# Patient Record
Sex: Female | Born: 1950 | Race: White | Hispanic: No | Marital: Married | Smoking: Never smoker
Health system: Southern US, Community
[De-identification: ages and names within clinical notes are randomized; demographics above are authoritative.]

---

## 1979-02-19 HISTORY — PX: TUBAL LIGATION: SHX77

## 2007-05-01 ENCOUNTER — Emergency Department (HOSPITAL_COMMUNITY): Admission: EM | Admit: 2007-05-01 | Discharge: 2007-05-01 | Payer: Self-pay | Admitting: Emergency Medicine

## 2007-09-14 ENCOUNTER — Emergency Department (HOSPITAL_COMMUNITY): Admission: EM | Admit: 2007-09-14 | Discharge: 2007-09-14 | Payer: Self-pay | Admitting: Emergency Medicine

## 2008-01-02 ENCOUNTER — Emergency Department (HOSPITAL_COMMUNITY): Admission: EM | Admit: 2008-01-02 | Discharge: 2008-01-02 | Payer: Self-pay | Admitting: Emergency Medicine

## 2009-07-25 ENCOUNTER — Emergency Department (HOSPITAL_COMMUNITY): Admission: EM | Admit: 2009-07-25 | Discharge: 2009-07-25 | Payer: Self-pay | Admitting: Family Medicine

## 2009-12-09 ENCOUNTER — Emergency Department (HOSPITAL_COMMUNITY)
Admission: EM | Admit: 2009-12-09 | Discharge: 2009-12-09 | Payer: Self-pay | Source: Home / Self Care | Admitting: Emergency Medicine

## 2010-11-20 LAB — POCT URINALYSIS DIP (DEVICE)
Glucose, UA: NEGATIVE mg/dL
Nitrite: NEGATIVE
Operator id: 282151
Protein, ur: NEGATIVE mg/dL
Specific Gravity, Urine: 1.01 (ref 1.005–1.030)
Urobilinogen, UA: 0.2 mg/dL (ref 0.0–1.0)

## 2010-11-20 LAB — URINE CULTURE

## 2011-05-13 ENCOUNTER — Emergency Department (HOSPITAL_COMMUNITY)
Admission: EM | Admit: 2011-05-13 | Discharge: 2011-05-13 | Disposition: A | Discharge status: Home / Self Care | Payer: Self-pay | Attending: Emergency Medicine | Admitting: Emergency Medicine

## 2011-05-13 ENCOUNTER — Encounter (HOSPITAL_COMMUNITY): Payer: Self-pay | Admitting: *Deleted

## 2011-05-13 DIAGNOSIS — R300 Dysuria: Secondary | ICD-10-CM | POA: Insufficient documentation

## 2011-05-13 LAB — URINALYSIS, ROUTINE W REFLEX MICROSCOPIC
Bilirubin Urine: NEGATIVE
Glucose, UA: NEGATIVE mg/dL
Leukocytes, UA: NEGATIVE
Nitrite: NEGATIVE
Specific Gravity, Urine: 1.019 (ref 1.005–1.030)
pH: 5.5 (ref 5.0–8.0)

## 2011-05-13 MED ORDER — FLUCONAZOLE 200 MG PO TABS: 200.0000 mg | ORAL_TABLET | Freq: Every day | ORAL | Status: AC

## 2011-05-13 MED ORDER — SULFAMETHOXAZOLE-TRIMETHOPRIM 800-160 MG PO TABS: 1.0000 | ORAL_TABLET | Freq: Two times a day (BID) | ORAL | Status: AC

## 2011-05-13 NOTE — ED Provider Notes (Signed)
History    61 year old female with dysuria. Onset about a week ago. Crampy lower abdominal pain. Dysuria increased frequency. No back pain. No fevers or chills. Denies or vomiting. History of prior UTI and says current symptoms are similar. Postmenopausal. No unusual vaginal bleeding or discharge.  CSN: [id number]  Arrival date & time 05/13/11  1212   First MD Initiated Contact with Patient 05/13/11 1311      Chief Complaint  Patient presents with  . Dysuria    frquency    (Consider location/radiation/quality/duration/timing/severity/associated sxs/prior treatment) HPI  History reviewed. No pertinent past medical history.  Past Surgical History  Procedure Date  . Tubal ligation     No family history on file.  History  Substance Use Topics  . Smoking status: Never Smoker   . Smokeless tobacco: Not on file  . Alcohol Use: No    OB History    Grav Para Term Preterm Abortions TAB SAB Ect Mult Living                  Review of Systems   Review of symptoms negative unless otherwise noted in HPI.   Allergies  Review of patient's allergies indicates no known allergies.  Home Medications   Current Outpatient Rx  Name Route Sig Dispense Refill  . ASPIRIN-ACETAMINOPHEN-CAFFEINE 250-250-65 MG PO TABS Oral Take 1 tablet by mouth every 6 (six) hours as needed. For headache      BP 157/74  Pulse 101  Temp(Src) 98.3 F (36.8 C) (Oral)  Resp 18  SpO2 98%  Physical Exam  Nursing note and vitals reviewed. Constitutional: She appears well-developed and well-nourished. No distress.  HENT:  Head: Normocephalic and atraumatic.  Eyes: Conjunctivae are normal. Pupils are equal, round, and reactive to light. Right eye exhibits no discharge. Left eye exhibits no discharge.  Neck: Neck supple.  Cardiovascular: Normal rate, regular rhythm and normal heart sounds.  Exam reveals no gallop and no friction rub.   No murmur heard. Pulmonary/Chest: Effort normal and breath sounds  normal. No respiratory distress.  Abdominal: Soft. She exhibits no distension and no mass. There is no tenderness.  Genitourinary:       No CVA tenderness  Musculoskeletal: She exhibits no edema and no tenderness.  Neurological: She is alert.  Skin: Skin is warm and dry. She is not diaphoretic.  Psychiatric: She has a normal mood and affect. Her behavior is normal. Thought content normal.    ED Course  Procedures (including critical care time)   Labs Reviewed  URINALYSIS, ROUTINE W REFLEX MICROSCOPIC   No results found.   1. Dysuria       MDM  61 year old female with dysuria. Urinalysis is not suggestive of infection at all. Discussed and recommend with patient pelvic examination. Patient does not want this at this time. Explained to her that this may be not be urinary tract infection and I cannot fully evaluate her without doing a pelvic examination. Pt would rather try course of abx. Will give course of bactrim. Strongly encouraged pt to return for evaluation if her symptoms do not improve within a couple days and immediately if anything changed or worsens.        Raeford Razor, MD 05/15/11 1447

## 2011-05-13 NOTE — ED Notes (Signed)
PT reports 2 wk HX of dysuria

## 2011-05-13 NOTE — ED Notes (Signed)
Pt states she thinks she is fighting a urinary tract infection - experiencing burning, frequency, painful urination.  Pt has tried OTC meds for urinary infection.

## 2011-05-13 NOTE — Discharge Instructions (Signed)
Dysuria Dysuria is the medical term for pain with urination. There are many causes for dysuria, but urinary tract infection is the most common. If a urinalysis was performed it can show that there is a urinary tract infection. A urine culture confirms that you or your child is sick. You will need to follow up with a healthcare provider because:  If a urine culture was done you will need to know the culture results and treatment recommendations.   If the urine culture was positive, you or your child will need to be put on antibiotics or know if the antibiotics prescribed are the right antibiotics for your urinary tract infection.   If the urine culture is negative (no urinary tract infection), then other causes may need to be explored or antibiotics need to be stopped.  Today laboratory work may have been done and there does not seem to be an infection. If cultures were done they will take at least 24 to 48 hours to be completed. Today x-rays may have been taken and they read as normal. No cause can be found for the problems. The x-rays may be re-read by a radiologist and you will be contacted if additional findings are made. You or your child may have been put on medications to help with this problem until you can see your primary caregiver. If the problems get better, see your primary caregiver if the problems return. If you were given antibiotics (medications which kill germs), take all of the mediations as directed for the full course of treatment.  If laboratory work was done, you need to find the results. Leave a telephone number where you can be reached. If this is not possible, make sure you find out how you are to get test results. HOME CARE INSTRUCTIONS   Drink lots of fluids. For adults, drink eight, 8 ounce glasses of clear juice or water a day. For children, replace fluids as suggested by your caregiver.   Empty the bladder often. Avoid holding urine for long periods of time.   After a  bowel movement, women should cleanse front to back, using each tissue only once.   Empty your bladder before and after sexual intercourse.   Take all the medicine given to you until it is gone. You may feel better in a few days, but TAKE ALL MEDICINE.   Avoid caffeine, tea, alcohol and carbonated beverages, because they tend to irritate the bladder.   In men, alcohol may irritate the prostate.   Only take over-the-counter or prescription medicines for pain, discomfort, or fever as directed by your caregiver.   If your caregiver has given you a follow-up appointment, it is very important to keep that appointment. Not keeping the appointment could result in a chronic or permanent injury, pain, and disability. If there is any problem keeping the appointment, you must call back to this facility for assistance.  SEEK IMMEDIATE MEDICAL CARE IF:   Back pain develops.   A fever develops.   There is nausea (feeling sick to your stomach) or vomiting (throwing up).   Problems are no better with medications or are getting worse.  MAKE SURE YOU:   Understand these instructions.   Will watch your condition.   Will get help right away if you are not doing well or get worse.  Document Released: 11/03/2003 Document Revised: 01/24/2011 Document Reviewed: 09/10/2007 [facility] Patient Information 2012 [place], [place].  RESOURCE GUIDE  Dental Problems  Patients with Medicaid: [facility]  [facility] [address]                                           [phone] [address] [employer] [address] Phone:  [phone]                                                  Phone:  [phone]  If unable to pay or uninsured, contact:  [facility] or [facility]. to become qualified for the adult dental clinic.  Chronic Pain Problems Contact [facility]  [phone] Patients need to be referred by their primary care doctor.  Insufficient Money for  Medicine Contact United Way:  call "211" or [facility] (469)640-9145.  No Primary Care Doctor Call [facility]  [phone] Other agencies that provide inexpensive medical care    [facility]  [phone]    [facility]  [phone]    [facility]  5757356892    [facility]  [phone]    [facility]  [phone]    [facility]  [phone]  Psychological Services [facility]  [phone] [facility]  [phone] [facility] Mental [facility]   [phone] (emergency services (612) [phone])  Substance Abuse Resources [facility]  (743)710-4335 [facility] 830-646-4075 [facility] [place] [phone] Floyd[facility] Flock (867)088-4350 Residential & Outpatient Substance Abuse Program  562-724-8715  Abuse/Neglect [facility] Child Abuse Hotline 340-085-3923 [facility] Child Abuse Hotline [phone] (After Hours)  Emergency Shelter [facility] [phone]  Maternity Homes [facility] [phone] [facility] [phone]  MRSA Hotline #:   601-010-4064    [facility]  [facility]     [employer]                          [facility]. [address] [place]                       [address]      [address]  Blondell Reveal Phone:  [phone]                                   Phone:  [phone]                 Phone:  [phone]  [facility] Mental Health Phone:  [phone]  [facility] Child Abuse Hotline (575) [phone] (After Hours)

## 2011-07-03 ENCOUNTER — Encounter (HOSPITAL_COMMUNITY): Payer: Self-pay | Admitting: *Deleted

## 2011-07-03 ENCOUNTER — Emergency Department (HOSPITAL_COMMUNITY)
Admission: EM | Admit: 2011-07-03 | Discharge: 2011-07-04 | Disposition: A | Discharge status: Home / Self Care | Payer: Self-pay | Attending: Emergency Medicine | Admitting: Emergency Medicine

## 2011-07-03 DIAGNOSIS — K047 Periapical abscess without sinus: Secondary | ICD-10-CM | POA: Insufficient documentation

## 2011-07-03 MED ORDER — PENICILLIN V POTASSIUM 250 MG PO TABS
500.0000 mg | ORAL_TABLET | Freq: Once | ORAL | Status: AC
  Administered 2011-07-03: 500 mg via ORAL
  Filled 2011-07-03: qty 2

## 2011-07-03 MED ORDER — OXYCODONE-ACETAMINOPHEN 5-325 MG PO TABS
2.0000 | ORAL_TABLET | Freq: Once | ORAL | Status: AC
  Administered 2011-07-03: 2 via ORAL
  Filled 2011-07-03: qty 2

## 2011-07-03 NOTE — ED Provider Notes (Addendum)
History     CSN: [id number]  Arrival date & time 07/03/11  1954   First MD Initiated Contact with Patient 07/03/11 2307      Chief Complaint  Patient presents with  . Dental Pain    (Consider location/radiation/quality/duration/timing/severity/associated sxs/prior treatment) HPI 61 yo female presents to emergency department complaining of right upper jaw pain. Patient reports she broke a tooth about 7-10 days ago.  This morning at 4 AM she had acute onset of pain in her right upper gums radiating up into her cheek. Throughout the day she's been taking Advil without any relief. She has had no fevers. She reports her dentist is currently on vacation in the [place]. He is to return back next week.  No prior h/o similar sxs.  No difficulties swallowing, breathing.  History reviewed. No pertinent past medical history.  Past Surgical History  Procedure Date  . Tubal ligation     No family history on file.  History  Substance Use Topics  . Smoking status: Never Smoker   . Smokeless tobacco: Not on file  . Alcohol Use: No    OB History    Grav Para Term Preterm Abortions TAB SAB Ect Mult Living                  Review of Systems  All other systems reviewed and are negative.    Allergies  Review of patient's allergies indicates no known allergies.  Home Medications  No current outpatient prescriptions on file.  BP 141/96  Pulse 75  Temp(Src) 98 F (36.7 C) (Oral)  Resp 18  SpO2 98%  Physical Exam  Constitutional: She appears well-developed and well-nourished.       Uncomfortable, irritable  HENT:  Head: Normocephalic and atraumatic.  Nose: Nose normal.       Broken first molar noted on right upper jaw with swelling perimolar without drainage, pain with palpation of area and of right cheek  Eyes: Conjunctivae and EOM are normal. Pupils are equal, round, and reactive to light.  Neck: Normal range of motion. Neck supple. No JVD present. No tracheal deviation  present. No thyromegaly present.  Cardiovascular: Normal rate, regular rhythm and intact distal pulses.   Pulmonary/Chest: Breath sounds normal. No stridor. No respiratory distress. She has no wheezes. She has no rales. She exhibits no tenderness.  Musculoskeletal: Normal range of motion. She exhibits no edema and no tenderness.  Lymphadenopathy:    She has no cervical adenopathy.  Skin: Skin is warm and dry. No rash noted. No erythema. No pallor.  Psychiatric: She has a normal mood and affect. Her behavior is normal. Judgment and thought content normal.    ED Course  Procedures (including critical care time)  Labs Reviewed - No data to display No results found.   1. Dental abscess       MDM  Dental abscess, will tx with abx, percocet.  Pt told to call dentist office to see if they have a covering dentist.  Will also give on call dental information        Olivia Mackie, MD 07/04/11 4098  Olivia Mackie, MD 07/04/11 [phone]

## 2011-07-03 NOTE — ED Notes (Signed)
Pt reports rt cheek swelling noted yesterday evening - pt reports dental pain to rt upper as well. Pt admits to breaking a tooth earlier this week in that area. Pt denies fever at present. Pt in no acute distress, family at bedside x1

## 2011-07-03 NOTE — ED Notes (Signed)
Patient reports she woke at 0400 today with pain in the right upper side of her mouth.  Patient states she has tried otc w/o relief.  Ibuprofen provides temporary relief.  Patient dentist is out of town in the [place].  Patient does have a tooth that broke on the same side last week.  Patient last took ibuprofen 4 hours ago

## 2011-07-04 MED ORDER — PENICILLIN V POTASSIUM 500 MG PO TABS: 500.0000 mg | ORAL_TABLET | Freq: Four times a day (QID) | ORAL | Status: AC

## 2011-07-04 MED ORDER — OXYCODONE-ACETAMINOPHEN 5-325 MG PO TABS: 2.0000 | ORAL_TABLET | ORAL | Status: AC | PRN

## 2011-07-04 NOTE — Discharge Instructions (Signed)
Please take medications as prescribed.  Return to the ER for worsening condition or new concerning symptoms.  Call your dentist office tomorrow to see if they have a covering dentist while yours is on vacation.  Otherwise, call the dentist listed.  Dental Abscess A dental abscess usually starts from an infected tooth. Antibiotic medicine and pain pills can be helpful, but dental infections require the attention of a dentist. Rinse around the infected area often with salt water (a pinch of salt in 8 oz of warm water). Do not apply heat to the outside of your face. See your dentist or oral surgeon as soon as possible.  SEEK IMMEDIATE MEDICAL CARE IF:  You have increasing, severe pain that is not relieved by medicine.   You or your child has an oral temperature above 102 F (38.9 C), not controlled by medicine.   Your baby is older than 3 months with a rectal temperature of 102 F (38.9 C) or higher.   Your baby is 54 months old or younger with a rectal temperature of 100.4 F (38 C) or higher.   You develop chills, severe headache, difficulty breathing, or trouble swallowing.   You have swelling in the neck or around the eye.  Document Released: 02/04/2005 Document Revised: 01/24/2011 Document Reviewed: 07/16/2006 [facility] Patient Information 2012 [place], [place].

## 2011-07-04 NOTE — ED Notes (Signed)
Rx given x2 D/c instructions reviewed w/ pt and family - pt and family deny any further questions or concerns at present. Pt ambulating independently w/ steady gait on d/c - A&Ox4 in no acute distress, family at bedside to drive pt home.
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
## 2012-10-28 ENCOUNTER — Emergency Department (HOSPITAL_COMMUNITY)
Admission: EM | Admit: 2012-10-28 | Discharge: 2012-10-28 | Discharge status: Against Medical Advice | Payer: Self-pay | Attending: Emergency Medicine | Admitting: Emergency Medicine

## 2012-10-28 ENCOUNTER — Encounter (HOSPITAL_COMMUNITY): Payer: Self-pay

## 2012-10-28 DIAGNOSIS — Y9289 Other specified places as the place of occurrence of the external cause: Secondary | ICD-10-CM | POA: Insufficient documentation

## 2012-10-28 DIAGNOSIS — T63001A Toxic effect of unspecified snake venom, accidental (unintentional), initial encounter: Secondary | ICD-10-CM | POA: Insufficient documentation

## 2012-10-28 DIAGNOSIS — Y9389 Activity, other specified: Secondary | ICD-10-CM | POA: Insufficient documentation

## 2012-10-28 DIAGNOSIS — T6391XA Toxic effect of contact with unspecified venomous animal, accidental (unintentional), initial encounter: Secondary | ICD-10-CM | POA: Insufficient documentation
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
## 2013-03-19 ENCOUNTER — Encounter (HOSPITAL_COMMUNITY): Payer: Self-pay | Admitting: Emergency Medicine

## 2013-03-19 ENCOUNTER — Emergency Department (HOSPITAL_COMMUNITY)
Admission: EM | Admit: 2013-03-19 | Discharge: 2013-03-19 | Disposition: A | Discharge status: Home / Self Care | Payer: Self-pay | Attending: Emergency Medicine | Admitting: Emergency Medicine

## 2013-03-19 DIAGNOSIS — K0889 Other specified disorders of teeth and supporting structures: Secondary | ICD-10-CM

## 2013-03-19 DIAGNOSIS — K089 Disorder of teeth and supporting structures, unspecified: Secondary | ICD-10-CM | POA: Insufficient documentation

## 2013-03-19 DIAGNOSIS — K029 Dental caries, unspecified: Secondary | ICD-10-CM | POA: Insufficient documentation

## 2013-03-19 MED ORDER — AMOXICILLIN 500 MG PO CAPS: 500.0000 mg | ORAL_CAPSULE | Freq: Three times a day (TID) | ORAL | Status: DC

## 2013-03-19 MED ORDER — HYDROCODONE-ACETAMINOPHEN 5-325 MG PO TABS: 1.0000 | ORAL_TABLET | ORAL | Status: DC | PRN

## 2013-03-19 MED ORDER — HYDROCODONE-ACETAMINOPHEN 5-325 MG PO TABS
1.0000 | ORAL_TABLET | Freq: Once | ORAL | Status: AC
  Administered 2013-03-19: 1 via ORAL
  Filled 2013-03-19: qty 1

## 2013-03-19 NOTE — ED Provider Notes (Signed)
CSN: [id number]     Arrival date & time 03/19/13  1906 History  This chart was scribed for non-physician practitioner Wylene SimmerFrancis Leeah Politano, PA-C working with Juliet RudeNathan R. Rubin PayorPickering, MD by Joaquin MusicKristina Sanchez-Matthews, ED Scribe. This patient was seen in room TR04C/TR04C and the patient's care was started at  8:07 PM  Chief Complaint  Patient presents with  . Oral Pain   The history is provided by the patient. No language interpreter was used.   HPI Comments: Arvin CollardGale T Bagg is a 63 y.o. female who presents to the Emergency Department complaining of ongoing worsening R sided dental pain that radiates up R ear to R lower jaw that began 3 days ago. Pt states she has "flare-ups of pain that generally begin shortly after brushing her teeth". Pt describes the pain as a burning sensation. Pt states she generally grinds her teeth and bites her mouth during her sleep and states "she has been doing this for years". Pt states she has been taking Ibuprofen without relief but states she has been using orajel and reports having relief. Pt states her pain generally returns the following day. Pt states she has a hx of oral abscess. Pt denies having L sided dental or ear pain. Pt denies rash.  History reviewed. No pertinent past medical history. Past Surgical History  Procedure Laterality Date  . Tubal ligation     No family history on file. History  Substance Use Topics  . Smoking status: Never Smoker   . Smokeless tobacco: Not on file  . Alcohol Use: No   OB History   Grav Para Term Preterm Abortions TAB SAB Ect Mult Living                 Review of Systems  All other systems reviewed and are negative.    Allergies  Review of patient's allergies indicates no known allergies.  Home Medications   Current Outpatient Rx  Name  Route  Sig  Dispense  Refill  . acetaminophen (TYLENOL) 500 MG tablet   Oral   Take 500 mg by mouth daily as needed for mild pain.         Marland Kitchen. ibuprofen (ADVIL,MOTRIN) 200 MG  tablet   Oral   Take 600 mg by mouth every 8 (eight) hours as needed for mild pain.          BP 164/82  Pulse 76  Temp(Src) 98.1 F (36.7 C)  Resp 16  Ht 5\' 9"  (1.753 m)  Wt 182 lb 9 oz (82.81 kg)  BMI 26.95 kg/m2  SpO2 99%  Physical Exam  Constitutional: She is oriented to person, place, and time. She appears well-developed and well-nourished. No distress.  HENT:  Head: Normocephalic and atraumatic.  Right Ear: External ear normal.  Left Ear: External ear normal.  Nose: Nose normal.  Mouth/Throat: Oropharynx is clear and moist. No oropharyngeal exudate.  No rash or vesiclular lesions noted, no facial pain with palpation - dentition poor with multiple caries, gingivitis noted.  No trismus, soft tissue edema noted.  Eyes: Conjunctivae are normal. Pupils are equal, round, and reactive to light. No scleral icterus.  Neck: Normal range of motion. Neck supple.  Pulmonary/Chest: Effort normal.  Musculoskeletal: Normal range of motion. She exhibits no edema and no tenderness.  Lymphadenopathy:    She has no cervical adenopathy.  Neurological: She is alert and oriented to person, place, and time. She exhibits normal muscle tone. Coordination normal.  Skin: Skin is warm and dry. No rash  noted. No erythema. No pallor.  Psychiatric: She has a normal mood and affect. Her behavior is normal. Judgment and thought content normal.    ED Course  Procedures DIAGNOSTIC STUDIES: Oxygen Saturation is 99% on RA, normal by my interpretation.    COORDINATION OF CARE: 8:13 PM-Discussed treatment plan which includes administer pain medication while in ED and advised pt to F/U with dentist. Pt agreed to plan.   Labs Review Labs Reviewed - No data to display Imaging Review No results found.  EKG Interpretation   None      MDM  Dental pain  Patient here with dental pain not relieved with OTC medication - will place on antibiotic and pain control - advised to see dentist, no evidence of  herpes zoster, trigeminal neuralgia, PTA, facial or soft tissue infection.  I personally performed the services described in this documentation, which was scribed in my presence. The recorded information has been reviewed and is accurate.    Izola Price Marisue Humble, PA-C 03/19/13 2022

## 2013-03-19 NOTE — ED Notes (Signed)
The pt has had mouth pain for 3 days.  Previous history of the same.  This time it did not go away .

## 2013-03-19 NOTE — Discharge Instructions (Signed)
Dental Pain °A tooth ache may be caused by cavities (tooth decay). Cavities expose the nerve of the tooth to air and hot or cold temperatures. It may come from an infection or abscess (also called a boil or furuncle) around your tooth. It is also often caused by dental caries (tooth decay). This causes the pain you are having. °DIAGNOSIS  °Your caregiver can diagnose this problem by exam. °TREATMENT  °· If caused by an infection, it may be treated with medications which kill germs (antibiotics) and pain medications as prescribed by your caregiver. Take medications as directed. °· Only take over-the-counter or prescription medicines for pain, discomfort, or fever as directed by your caregiver. °· Whether the tooth ache today is caused by infection or dental disease, you should see your dentist as soon as possible for further care. °SEEK MEDICAL CARE IF: °The exam and treatment you received today has been provided on an emergency basis only. This is not a substitute for complete medical or dental care. If your problem worsens or new problems (symptoms) appear, and you are unable to meet with your dentist, call or return to this location. °SEEK IMMEDIATE MEDICAL CARE IF:  °· You have a fever. °· You develop redness and swelling of your face, jaw, or neck. °· You are unable to open your mouth. °· You have severe pain uncontrolled by pain medicine. °MAKE SURE YOU:  °· Understand these instructions. °· Will watch your condition. °· Will get help right away if you are not doing well or get worse. °Document Released: 02/04/2005 Document Revised: 04/29/2011 Document Reviewed: 09/23/2007 °[employer]® Patient Information ©2014 [employer]. ° °Dental Care and Dentist Visits °Dental care supports good overall health. Regular dental visits can also help you avoid dental pain, bleeding, infection, and other more serious health problems in the future. It is important to keep the mouth healthy because diseases in the teeth, gums,  and other oral tissues can spread to other areas of the body. Some problems, such as diabetes, heart disease, and pre-term labor have been associated with poor oral health.  °See your dentist every 6 months. If you experience emergency problems such as a toothache or broken tooth, go to the dentist right away. If you see your dentist regularly, you may catch problems early. It is easier to be treated for problems in the early stages.  °WHAT TO EXPECT AT A DENTIST VISIT  °Your dentist will look for many common oral health problems and recommend proper treatment. At your regular dental visit, you can expect: °· Gentle cleaning of the teeth and gums. This includes scraping and polishing. This helps to remove the sticky substance around the teeth and gums (plaque). Plaque forms in the mouth shortly after eating. Over time, plaque hardens on the teeth as tartar. If tartar is not removed regularly, it can cause problems. Cleaning also helps remove stains. °· Periodic X-rays. These pictures of the teeth and supporting bone will help your dentist assess the health of your teeth. °· Periodic fluoride treatments. Fluoride is a natural mineral shown to help strengthen teeth. Fluoride treatment involves applying a fluoride gel or varnish to the teeth. It is most commonly done in children. °· Examination of the mouth, tongue, jaws, teeth, and gums to look for any oral health problems, such as: °· Cavities (dental caries). This is decay on the tooth caused by plaque, sugar, and acid in the mouth. It is best to catch a cavity when it is small. °· Inflammation of the gums   caused by plaque buildup (gingivitis). °· Problems with the mouth or malformed or misaligned teeth. °· Oral cancer or other diseases of the soft tissues or jaws.  °KEEP YOUR TEETH AND GUMS HEALTHY °For healthy teeth and gums, follow these general guidelines as well as your dentist's specific advice: °· Have your teeth professionally cleaned at the dentist every 6  months. °· Brush twice daily with a fluoride toothpaste. °· Floss your teeth daily.  °· Ask your dentist if you need fluoride supplements, treatments, or fluoride toothpaste. °· Eat a healthy diet. Reduce foods and drinks with added sugar. °· Avoid smoking. °TREATMENT FOR ORAL HEALTH PROBLEMS °If you have oral health problems, treatment varies depending on the conditions present in your teeth and gums. °· Your caregiver will most likely recommend good oral hygiene at each visit. °· For cavities, gingivitis, or other oral health disease, your caregiver will perform a procedure to treat the problem. This is typically done at a separate appointment. Sometimes your caregiver will refer you to another dental specialist for specific tooth problems or for surgery. °SEEK IMMEDIATE DENTAL CARE IF: °· You have pain, bleeding, or soreness in the gum, tooth, jaw, or mouth area. °· A permanent tooth becomes loose or separated from the gum socket. °· You experience a blow or injury to the mouth or jaw area. °Document Released: 10/17/2010 Document Revised: 04/29/2011 Document Reviewed: 10/17/2010 °ExitCare® Patient Information ©2014 [employer]. ° °

## 2013-03-21 NOTE — ED Provider Notes (Signed)
Medical screening examination/treatment/procedure(s) were performed by non-physician practitioner and as supervising physician I was immediately available for consultation/collaboration.  EKG Interpretation   None        Juliet RudeNathan R. Rubin PayorPickering, MD 03/21/13 [phone]

## 2013-05-21 ENCOUNTER — Encounter (HOSPITAL_COMMUNITY): Payer: Self-pay | Admitting: Emergency Medicine

## 2013-05-21 ENCOUNTER — Emergency Department (HOSPITAL_COMMUNITY)
Admission: EM | Admit: 2013-05-21 | Discharge: 2013-05-21 | Disposition: A | Discharge status: Home / Self Care | Payer: Self-pay | Attending: Emergency Medicine | Admitting: Emergency Medicine

## 2013-05-21 DIAGNOSIS — J329 Chronic sinusitis, unspecified: Secondary | ICD-10-CM

## 2013-05-21 MED ORDER — AMOXICILLIN 500 MG PO CAPS: 1000.0000 mg | ORAL_CAPSULE | Freq: Two times a day (BID) | ORAL | Status: DC

## 2013-05-21 MED ORDER — FLUTICASONE PROPIONATE 50 MCG/ACT NA SUSP: 1.0000 | Freq: Every day | NASAL | Status: DC

## 2013-05-21 NOTE — ED Notes (Signed)
Pt presents with c/o sinus pressure and nasal congestion. Pt c/o a horrible smell that she believes is coming from her nose and the nasal drainage. Pt says she was experiencing generalized body aches earlier this week.

## 2013-05-21 NOTE — ED Provider Notes (Signed)
CSN: [id number]     Arrival date & time 05/21/13  1532 History  This chart was scribed for non-physician practitioner, Elpidio AnisShari Nyesha Cliff, PA-C,working with Shanna CiscoMegan E Docherty, MD, by Karle PlumberJennifer Tensley, ED Scribe.  This patient was seen in room WTR8/WTR8 and the patient's care was started at 4:07 PM.  Chief Complaint  Patient presents with  . Nasal Congestion  . Sinus Problem   The history is provided by the patient. No language interpreter was used.   HPI Comments:  Jill Vega is a 63 y.o. female who presents to the Emergency Department complaining of sinus pressure and nasal congestion that started approximately one week. She reports associated post nasal drip, sinus pressure, chills, and a foul smell due to the drainage. She denies sore throat, fever, or cough.  History reviewed. No pertinent past medical history. Past Surgical History  Procedure Laterality Date  . Tubal ligation     No family history on file. History  Substance Use Topics  . Smoking status: Never Smoker   . Smokeless tobacco: Not on file  . Alcohol Use: No   OB History   Grav Para Term Preterm Abortions TAB SAB Ect Mult Living                 Review of Systems  Constitutional: Negative for fever.  HENT: Positive for congestion and sinus pressure. Negative for sore throat.   Respiratory: Negative for cough.   All other systems reviewed and are negative.    Allergies  Review of patient's allergies indicates no known allergies.  Home Medications   Current Outpatient Rx  Name  Route  Sig  Dispense  Refill  . acetaminophen (TYLENOL) 500 MG tablet   Oral   Take 500 mg by mouth daily as needed for mild pain.         Marland Kitchen. amoxicillin (AMOXIL) 500 MG capsule   Oral   Take 1 capsule (500 mg total) by mouth 3 (three) times daily.   21 capsule   0   . HYDROcodone-acetaminophen (NORCO/VICODIN) 5-325 MG per tablet   Oral   Take 1 tablet by mouth every 4 (four) hours as needed for moderate pain.   20 tablet    0   . ibuprofen (ADVIL,MOTRIN) 200 MG tablet   Oral   Take 600 mg by mouth every 8 (eight) hours as needed for mild pain.          Triage Vitals: BP 145/85  Pulse 104  Temp(Src) 98.3 F (36.8 C) (Oral)  Resp 18  SpO2 100% Physical Exam  Nursing note and vitals reviewed. Constitutional: She is oriented to person, place, and time. She appears well-developed and well-nourished.  HENT:  Head: Normocephalic and atraumatic.  Right Ear: Tympanic membrane normal.  Left Ear: Tympanic membrane normal.  Mouth/Throat: Uvula is midline. Posterior oropharyngeal edema present. No oropharyngeal exudate or posterior oropharyngeal erythema.  Post nasal drainage  Eyes: EOM are normal.  Neck: Normal range of motion.  Cardiovascular: Normal rate, regular rhythm and normal heart sounds.  Exam reveals no gallop and no friction rub.   No murmur heard. Pulmonary/Chest: Effort normal and breath sounds normal. No respiratory distress. She has no wheezes. She has no rales. She exhibits no tenderness.  Musculoskeletal: Normal range of motion.  Neurological: She is alert and oriented to person, place, and time.  Skin: Skin is warm and dry.  Psychiatric: She has a normal mood and affect. Her behavior is normal.    ED Course  Procedures (including critical care time) DIAGNOSTIC STUDIES: Oxygen Saturation is 100% on RA, normal by my interpretation.   COORDINATION OF CARE: 4:11 PM- Will prescribe Amoxicillin and Flonase. Pt verbalizes understanding and agrees to plan.  Medications - No data to display  Labs Review Labs Reviewed - No data to display Imaging Review No results found.   EKG Interpretation None      MDM   Final diagnoses:  None    1. Sinusitis  Uncomplicated sinus infection treatable with abs.   I personally performed the services described in this documentation, which was scribed in my presence. The recorded information has been reviewed and is accurate.    Arnoldo Hooker, PA-C 05/22/13 1950

## 2013-05-21 NOTE — Discharge Instructions (Signed)

## 2013-05-23 NOTE — ED Provider Notes (Signed)
Medical screening examination/treatment/procedure(s) were performed by non-physician practitioner and as supervising physician I was immediately available for consultation/collaboration.   Kamla Skilton E Jernee Murtaugh, MD 05/23/13 1047 

## 2013-10-14 ENCOUNTER — Encounter (HOSPITAL_COMMUNITY): Payer: Self-pay | Admitting: Emergency Medicine

## 2013-10-14 ENCOUNTER — Emergency Department (HOSPITAL_COMMUNITY)
Admission: EM | Admit: 2013-10-14 | Discharge: 2013-10-14 | Disposition: A | Discharge status: Home / Self Care | Payer: Self-pay | Attending: Emergency Medicine | Admitting: Emergency Medicine

## 2013-10-14 DIAGNOSIS — R300 Dysuria: Secondary | ICD-10-CM | POA: Insufficient documentation

## 2013-10-14 DIAGNOSIS — IMO0002 Reserved for concepts with insufficient information to code with codable children: Secondary | ICD-10-CM | POA: Insufficient documentation

## 2013-10-14 DIAGNOSIS — N390 Urinary tract infection, site not specified: Secondary | ICD-10-CM

## 2013-10-14 DIAGNOSIS — Z79899 Other long term (current) drug therapy: Secondary | ICD-10-CM | POA: Insufficient documentation

## 2013-10-14 DIAGNOSIS — Z792 Long term (current) use of antibiotics: Secondary | ICD-10-CM | POA: Insufficient documentation

## 2013-10-14 LAB — URINALYSIS, ROUTINE W REFLEX MICROSCOPIC
BILIRUBIN URINE: NEGATIVE
GLUCOSE, UA: NEGATIVE mg/dL
HGB URINE DIPSTICK: NEGATIVE
KETONES UR: 15 mg/dL — AB
LEUKOCYTES UA: NEGATIVE
Nitrite: NEGATIVE
PROTEIN: NEGATIVE mg/dL
Specific Gravity, Urine: 1.019 (ref 1.005–1.030)
Urobilinogen, UA: 1 mg/dL (ref 0.0–1.0)
pH: 6 (ref 5.0–8.0)

## 2013-10-14 MED ORDER — CEFTRIAXONE SODIUM 1 G IJ SOLR
1.0000 g | Freq: Once | INTRAMUSCULAR | Status: AC
  Administered 2013-10-14: 1 g via INTRAMUSCULAR
  Filled 2013-10-14: qty 10

## 2013-10-14 MED ORDER — LIDOCAINE HCL (PF) 1 % IJ SOLN
INTRAMUSCULAR | Status: AC
  Administered 2013-10-14: 5 mL
  Filled 2013-10-14: qty 5

## 2013-10-14 MED ORDER — CEPHALEXIN 500 MG PO CAPS: 500.0000 mg | ORAL_CAPSULE | Freq: Three times a day (TID) | ORAL | Status: DC

## 2013-10-14 NOTE — ED Provider Notes (Signed)
Chief Complaint   Chief Complaint  Patient presents with  . Dysuria  . Nausea    History of Present Illness   Jill Vega is a 63 year old female who has had a one half week history of dysuria, burning, frequency, and urgency. She denies any hematuria. She has also had pain in her lower abdomen and lower back, chills, and nausea but no fever or vomiting no GYN complaints. Last urinary tract infection was 3 years ago.  Review of Systems   Other than as noted above, the patient denies any of the following symptoms: General:  No fevers or chills. GI:  No abdominal pain, back pain, nausea, or vomiting. GU:  No hematuria or incontinence. GYN:  No discharge, itching, vulvar pain or lesions, pelvic pain, or abnormal vaginal bleeding.  PMFSH   Past medical history, family history, social history, meds, and allergies were reviewed.    Physical Examination     Vital signs:  BP 156/85  Pulse 91  Temp(Src) 98.7 F (37.1 C)  Resp 16  Wt 179 lb (81.194 kg)  SpO2 100% Gen:  Alert, oriented, in no distress. Lungs:  Clear to auscultation, no wheezes, rales or rhonchi. Heart:  Regular rhythm, no gallop or murmer. Abdomen:  Flat and soft. There was slight suprapubic pain to palpation.  No guarding, or rebound.  No hepato-splenomegaly or mass.  Bowel sounds were normally active.  No hernia. Back:  No CVA tenderness.  Skin:  Clear, warm and dry.  Labs   Results for orders placed during the hospital encounter of 10/14/13  URINALYSIS, ROUTINE W REFLEX MICROSCOPIC      Result Value Ref Range   Color, Urine YELLOW  YELLOW   APPearance CLEAR  CLEAR   Specific Gravity, Urine 1.019  1.005 - 1.030   pH 6.0  5.0 - 8.0   Glucose, UA NEGATIVE  NEGATIVE mg/dL   Hgb urine dipstick NEGATIVE  NEGATIVE   Bilirubin Urine NEGATIVE  NEGATIVE   Ketones, ur 15 (*) NEGATIVE mg/dL   Protein, ur NEGATIVE  NEGATIVE mg/dL   Urobilinogen, UA 1.0  0.0 - 1.0 mg/dL   Nitrite NEGATIVE  NEGATIVE   Leukocytes,  UA NEGATIVE  NEGATIVE     A urine culture was obtained.  Results are pending at this time and we will call about any positive results.  Course in Urgent Care Center   She was given Rocephin 1 g IM.  Assessment   The encounter diagnosis was UTI (lower urinary tract infection).   Urine looks fairly benign, but she has a very tender the same symptom picture. I am also concerned she may have Jill acute pyelonephritis, and eversion to come back if she should become worse in any way, especially with increasing fever, vomiting, or worsening pain. A culture is pending.  Plan   1.  Meds:  The following meds were prescribed:   New Prescriptions   CEPHALEXIN (KEFLEX) 500 MG CAPSULE    Take 1 capsule (500 mg total) by mouth 3 (three) times daily.    2.  Patient Education/Counseling:  The patient was given appropriate handouts, self care instructions, and instructed in symptomatic relief. The patient was told to avoid intercourse for 10 days, get extra fluids, and return for a follow up with her primary care doctor at the completion of treatment for a repeat UA and culture.    3.  Follow up:  The patient was told to follow up here if no better in 3 to  4 days, or sooner if becoming worse in any way, and given some red flag symptoms such as fever, persistent vomiting, or severe flank or abdominal pain which would prompt immediate return.     Reuben Likes, MD 10/14/13 [phone]

## 2013-10-14 NOTE — ED Notes (Signed)
Pt reports 2 weeks of pressure and pain with urination. Pt reports abdominal and back pain. Reports chills, unsure of fever. Pt awake, alert, oriented x4, VSS.

## 2013-10-14 NOTE — Discharge Instructions (Signed)

## 2013-10-15 LAB — URINE CULTURE
CULTURE: NO GROWTH
Colony Count: NO GROWTH

## 2013-10-19 ENCOUNTER — Emergency Department (HOSPITAL_COMMUNITY)
Admission: EM | Admit: 2013-10-19 | Discharge: 2013-10-19 | Disposition: A | Discharge status: Home / Self Care | Payer: Self-pay | Attending: Emergency Medicine | Admitting: Emergency Medicine

## 2013-10-19 ENCOUNTER — Encounter (HOSPITAL_COMMUNITY): Payer: Self-pay | Admitting: Emergency Medicine

## 2013-10-19 DIAGNOSIS — Z9851 Tubal ligation status: Secondary | ICD-10-CM | POA: Insufficient documentation

## 2013-10-19 DIAGNOSIS — R109 Unspecified abdominal pain: Secondary | ICD-10-CM | POA: Insufficient documentation

## 2013-10-19 DIAGNOSIS — Z792 Long term (current) use of antibiotics: Secondary | ICD-10-CM | POA: Insufficient documentation

## 2013-10-19 DIAGNOSIS — Z79899 Other long term (current) drug therapy: Secondary | ICD-10-CM | POA: Insufficient documentation

## 2013-10-19 DIAGNOSIS — R1030 Lower abdominal pain, unspecified: Secondary | ICD-10-CM

## 2013-10-19 DIAGNOSIS — R300 Dysuria: Secondary | ICD-10-CM | POA: Insufficient documentation

## 2013-10-19 LAB — CBC WITH DIFFERENTIAL/PLATELET
BASOS ABS: 0 10*3/uL (ref 0.0–0.1)
Basophils Relative: 0 % (ref 0–1)
Eosinophils Absolute: 0 10*3/uL (ref 0.0–0.7)
Eosinophils Relative: 0 % (ref 0–5)
HCT: 35 % — ABNORMAL LOW (ref 36.0–46.0)
Hemoglobin: 12.1 g/dL (ref 12.0–15.0)
LYMPHS ABS: 2 10*3/uL (ref 0.7–4.0)
LYMPHS PCT: 28 % (ref 12–46)
MCH: 30.8 pg (ref 26.0–34.0)
MCHC: 34.6 g/dL (ref 30.0–36.0)
MCV: 89.1 fL (ref 78.0–100.0)
Monocytes Absolute: 0.5 10*3/uL (ref 0.1–1.0)
Monocytes Relative: 6 % (ref 3–12)
NEUTROS ABS: 4.8 10*3/uL (ref 1.7–7.7)
NEUTROS PCT: 66 % (ref 43–77)
PLATELETS: 185 10*3/uL (ref 150–400)
RBC: 3.93 MIL/uL (ref 3.87–5.11)
RDW: 12.4 % (ref 11.5–15.5)
WBC: 7.3 10*3/uL (ref 4.0–10.5)

## 2013-10-19 LAB — COMPREHENSIVE METABOLIC PANEL
ALBUMIN: 4.3 g/dL (ref 3.5–5.2)
ALT: 13 U/L (ref 0–35)
ANION GAP: 14 (ref 5–15)
AST: 18 U/L (ref 0–37)
Alkaline Phosphatase: 104 U/L (ref 39–117)
BUN: 13 mg/dL (ref 6–23)
CALCIUM: 9.6 mg/dL (ref 8.4–10.5)
CO2: 26 mEq/L (ref 19–32)
Chloride: 104 mEq/L (ref 96–112)
Creatinine, Ser: 0.67 mg/dL (ref 0.50–1.10)
GFR calc Af Amer: >90 mL/min (ref >90)
GFR calc non Af Amer: >90 mL/min (ref >90)
Glucose, Bld: 95 mg/dL (ref 70–99)
Potassium: 3.7 mEq/L (ref 3.7–5.3)
Sodium: 144 mEq/L (ref 137–147)
TOTAL PROTEIN: 7.4 g/dL (ref 6.0–8.3)
Total Bilirubin: 0.7 mg/dL (ref 0.3–1.2)

## 2013-10-19 LAB — URINALYSIS, ROUTINE W REFLEX MICROSCOPIC
BILIRUBIN URINE: NEGATIVE
Glucose, UA: NEGATIVE mg/dL
Hgb urine dipstick: NEGATIVE
Ketones, ur: NEGATIVE mg/dL
Leukocytes, UA: NEGATIVE
Nitrite: NEGATIVE
PROTEIN: NEGATIVE mg/dL
SPECIFIC GRAVITY, URINE: 1.011 (ref 1.005–1.030)
UROBILINOGEN UA: 0.2 mg/dL (ref 0.0–1.0)
pH: 7 (ref 5.0–8.0)

## 2013-10-19 LAB — LIPASE, BLOOD: Lipase: 16 U/L (ref 11–59)

## 2013-10-19 MED ORDER — PHENAZOPYRIDINE HCL 100 MG PO TABS
95.0000 mg | ORAL_TABLET | Freq: Once | ORAL | Status: AC
  Administered 2013-10-19: 100 mg via ORAL
  Filled 2013-10-19: qty 1

## 2013-10-19 MED ORDER — PHENAZOPYRIDINE HCL 100 MG PO TABS: 100.0000 mg | ORAL_TABLET | Freq: Three times a day (TID) | ORAL | Status: DC | PRN

## 2013-10-19 MED ORDER — SODIUM CHLORIDE 0.9 % IV BOLUS (SEPSIS)
1000.0000 mL | Freq: Once | INTRAVENOUS | Status: AC
  Administered 2013-10-19: 1000 mL via INTRAVENOUS

## 2013-10-19 MED ORDER — SODIUM CHLORIDE 0.9 % IV SOLN
1000.0000 mL | Freq: Once | INTRAVENOUS | Status: DC
Start: 2013-10-19 — End: 2013-10-19

## 2013-10-19 NOTE — ED Provider Notes (Signed)
CSN: [id number]     Arrival date & time 10/19/13  1258 History   First MD Initiated Contact with Patient 10/19/13 1535     Chief Complaint  Patient presents with  . Flank Pain    The patient said she was here last thursday for the same symptoms and was given penicillin and discharged.  The patient is back her symptoms have gotten worse and she her pain is 8/10.  Marland Kitchen Dysuria     HPI  Patient presents with concerns of ongoing urinary frequency, burning, lower abdominal pressure. Symptoms have been present for approximately one week, including during a recent evaluation at urgent care. At the time she started on antibiotics. She notes her symptoms have been persistent, without appreciable change. The lower abdominal pressure is moderate, irritating, minimally improved with OTC medication. There is no new fever or chills. There is no low back discomfort. There is no new incontinence, falling, weakness.   History reviewed. No pertinent past medical history. Past Surgical History  Procedure Laterality Date  . Tubal ligation     History reviewed. No pertinent family history. History  Substance Use Topics  . Smoking status: Never Smoker   . Smokeless tobacco: Not on file  . Alcohol Use: No   OB History   Grav Para Term Preterm Abortions TAB SAB Ect Mult Living                 Review of Systems  Constitutional:       Per HPI, otherwise negative  HENT:       Per HPI, otherwise negative  Respiratory:       Per HPI, otherwise negative  Cardiovascular:       Per HPI, otherwise negative  Gastrointestinal: Negative for vomiting.  Endocrine:       Negative aside from HPI  Genitourinary:       Neg aside from HPI   Musculoskeletal:       Per HPI, otherwise negative  Skin: Negative.   Neurological: Negative for syncope.      Allergies  Review of patient's allergies indicates no known allergies.  Home Medications   Prior to Admission medications   Medication Sig Start Date  End Date Taking? Authorizing Provider  diphenhydrAMINE (BENADRYL) 25 mg capsule Take 25 mg by mouth at bedtime.   Yes Historical Provider, MD  Lactobacillus Rhamnosus, GG, (CULTURELLE PO) Take 1 capsule by mouth daily.   Yes Historical Provider, MD   BP 141/68  Pulse 76  Temp(Src) 98.1 F (36.7 C) (Oral)  Resp 16  SpO2 100% Physical Exam  Nursing note and vitals reviewed. Constitutional: She is oriented to person, place, and time. She appears well-developed and well-nourished. No distress.  HENT:  Head: Normocephalic and atraumatic.  Eyes: Conjunctivae and EOM are normal.  Cardiovascular: Normal rate and regular rhythm.   Pulmonary/Chest: Effort normal and breath sounds normal. No stridor. No respiratory distress.  Abdominal: She exhibits no distension. There is tenderness in the suprapubic area. There is no rigidity, no rebound and no guarding.  Musculoskeletal: She exhibits no edema.  Neurological: She is alert and oriented to person, place, and time. No cranial nerve deficit.  Skin: Skin is warm and dry.  Psychiatric: She has a normal mood and affect.    ED Course  Procedures (including critical care time) Labs Review Labs Reviewed  CBC WITH DIFFERENTIAL - Abnormal; Notable for the following:    HCT 35.0 (*)    All other components within normal  limits  COMPREHENSIVE METABOLIC PANEL  LIPASE, BLOOD  URINALYSIS, ROUTINE W REFLEX MICROSCOPIC    I reviewed the patient's chart.  I reviewed the patient is in no distress.  We discussed all findings, presumptive diagnosis of urethritis versus vaginitis or cystitis. We discussed the utility of imaging at this time.  Given insurance concerns, the patient defers this recommendation. Discussed the need to consider this as an outpatient. Patient states that she has not seen a gynecologist in at least 3 decades.  She was encouraged to do so this week.   MDM   Final diagnoses:  Lower abdominal pain    Patient presents with  ongoing suprapubic pain, dysuria. Notably, the patient has had no improvement with antibiotics. Chart review demonstrates the patient's culture from last week's evaluation was unremarkable, and today's evaluation does not suggest ongoing urinary tract infection. Patient's labs, physical exam are largely reassuring. , The patient will benefit from CT imaging or ultrasound, this can safely be considered as an outpatient given the unremarkable vital signs, labs, absence of distress.     Gerhard Munch, MD 10/19/13 [phone]

## 2013-10-19 NOTE — ED Notes (Signed)
The patient said she was here last thursday for the same symptoms and was given penicillin and discharged.  The patient is back her symptoms have gotten worse and she her pain is 8/10.  The patient was told to return to the ED if her symptoms get worse.

## 2013-10-19 NOTE — Discharge Instructions (Signed)
As discussed, your evaluation is largely reassuring.  Your findings suggest the presence of either vaginitis or urethritis, and possibly cystitis as a cause of your ongoing discomfort. It is important to follow this up with the clinic, and our [facility] specialists for further evaluation and management. Return here for concerning changes in her condition.   Abdominal Pain Many things can cause abdominal pain. Usually, abdominal pain is not caused by a disease and will improve without treatment. It can often be observed and treated at home. Your health care provider will do a physical exam and possibly order blood tests and X-rays to help determine the seriousness of your pain. However, in many cases, more time must pass before a clear cause of the pain can be found. Before that point, your health care provider may not know if you need more testing or further treatment. HOME CARE INSTRUCTIONS  Monitor your abdominal pain for any changes. The following actions may help to alleviate any discomfort you are experiencing:  Only take over-the-counter or prescription medicines as directed by your health care provider.  Do not take laxatives unless directed to do so by your health care provider.  Try a clear liquid diet (broth, tea, or water) as directed by your health care provider. Slowly move to a bland diet as tolerated. SEEK MEDICAL CARE IF:  You have unexplained abdominal pain.  You have abdominal pain associated with nausea or diarrhea.  You have pain when you urinate or have a bowel movement.  You experience abdominal pain that wakes you in the night.  You have abdominal pain that is worsened or improved by eating food.  You have abdominal pain that is worsened with eating fatty foods.  You have a fever. SEEK IMMEDIATE MEDICAL CARE IF:   Your pain does not go away within 2 hours.  You keep throwing up (vomiting).  Your pain is felt only in portions of the abdomen, such as  the right side or the left lower portion of the abdomen.  You pass bloody or black tarry stools. MAKE SURE YOU:  Understand these instructions.   Will watch your condition.   Will get help right away if you are not doing well or get worse.  Document Released: 11/14/2004 Document Revised: 02/09/2013 Document Reviewed: 10/14/2012 [facility] Patient Information 2015 [place], [place]. This information is not intended to replace advice given to you by your health care provider. Make sure you discuss any questions you have with your health care provider.

## 2013-10-27 ENCOUNTER — Ambulatory Visit: Payer: Self-pay | Admitting: Internal Medicine

## 2013-11-17 ENCOUNTER — Encounter: Payer: Self-pay | Admitting: Family Medicine

## 2013-11-17 ENCOUNTER — Ambulatory Visit: Payer: Self-pay | Attending: Family Medicine | Admitting: Family Medicine

## 2013-11-17 VITALS — BP 150/84 | HR 84 | Temp 97.8°F | Resp 18 | Ht 70.5 in | Wt 181.0 lb

## 2013-11-17 DIAGNOSIS — Z23 Encounter for immunization: Secondary | ICD-10-CM

## 2013-11-17 DIAGNOSIS — F329 Major depressive disorder, single episode, unspecified: Secondary | ICD-10-CM | POA: Insufficient documentation

## 2013-11-17 DIAGNOSIS — R102 Pelvic and perineal pain: Secondary | ICD-10-CM

## 2013-11-17 DIAGNOSIS — Z634 Disappearance and death of family member: Secondary | ICD-10-CM | POA: Insufficient documentation

## 2013-11-17 DIAGNOSIS — R630 Anorexia: Secondary | ICD-10-CM | POA: Insufficient documentation

## 2013-11-17 DIAGNOSIS — Z299 Encounter for prophylactic measures, unspecified: Secondary | ICD-10-CM

## 2013-11-17 DIAGNOSIS — N952 Postmenopausal atrophic vaginitis: Secondary | ICD-10-CM | POA: Insufficient documentation

## 2013-11-17 DIAGNOSIS — F4321 Adjustment disorder with depressed mood: Secondary | ICD-10-CM

## 2013-11-17 DIAGNOSIS — N949 Unspecified condition associated with female genital organs and menstrual cycle: Secondary | ICD-10-CM

## 2013-11-17 DIAGNOSIS — R300 Dysuria: Secondary | ICD-10-CM

## 2013-11-17 DIAGNOSIS — F3289 Other specified depressive episodes: Secondary | ICD-10-CM | POA: Insufficient documentation

## 2013-11-17 DIAGNOSIS — Z124 Encounter for screening for malignant neoplasm of cervix: Secondary | ICD-10-CM | POA: Insufficient documentation

## 2013-11-17 DIAGNOSIS — F4329 Adjustment disorder with other symptoms: Secondary | ICD-10-CM | POA: Insufficient documentation

## 2013-11-17 LAB — CERVICOVAGINAL ANCILLARY ONLY
WET PREP (BD AFFIRM): NEGATIVE
Wet Prep (BD Affirm): NEGATIVE
Wet Prep (BD Affirm): NEGATIVE

## 2013-11-17 LAB — POCT URINALYSIS DIPSTICK
Bilirubin, UA: NEGATIVE
Blood, UA: NEGATIVE
Glucose, UA: NEGATIVE
Ketones, UA: NEGATIVE
Leukocytes, UA: NEGATIVE
Nitrite, UA: NEGATIVE
PH UA: 6.5
PROTEIN UA: NEGATIVE
SPEC GRAV UA: 1.01
Urobilinogen, UA: 0.2

## 2013-11-17 MED ORDER — MIRTAZAPINE 15 MG PO TABS: 15.0000 mg | ORAL_TABLET | Freq: Every day | ORAL | Status: AC

## 2013-11-17 NOTE — Assessment & Plan Note (Signed)
A: on exam P: F/u pap Topical estrogen planned if pap normal

## 2013-11-17 NOTE — Assessment & Plan Note (Signed)
A: depression/bereavment with anorexia and weight loss P: remeron  In house counseling, patient amenable

## 2013-11-17 NOTE — Assessment & Plan Note (Signed)
Pap done today  

## 2013-11-17 NOTE — Patient Instructions (Addendum)
Mrs. Jill Vega,   Thank you for coming in today. It was a pleasure meeting you. I look forward to being your primary doctor.   1. For pelvic pain following urination, I do believe the pain me for initially was referred pain from your low back after lifting the laminate  Flooring. The exam today was revealing for what looks like atrophic vaginitis which is thinning of the vaginal tissue that occurs around after menopause in most women due to lack of estrogen. Treatment for this is usually vaginal estrogen cream or ointment.  You'll be called with the results of your Pap smear and other testing. Your urinalysis was normal.  2. regarding your depressive symptoms or weight loss related to your grief. Recommend counseling services. I recommend that has a pain which is an antidepressant that also helps with insomnia and lack of appetite related to depression/grief. Please take one pill nightly.  F/u in 3-4 weeks   Dr. Armen PickupFunches   Atrophic Vaginitis Atrophic vaginitis is a problem of low levels of estrogen in women. This problem can happen at any age. It is most common in women who have gone through menopause ("the change").  HOW WILL I KNOW IF I HAVE THIS PROBLEM? You may have:  Trouble with peeing (urinating), such as:  Going to the bathroom often.  A hard time holding your pee until you reach a bathroom.  Leaking pee.  Having pain when you pee.  Itching or a burning feeling.  Vaginal bleeding and spotting.  Pain during sex.  Dryness of the vagina.  A yellow, bad-smelling fluid (discharge) coming from the vagina. HOW WILL MY DOCTOR CHECK FOR THIS PROBLEM?  During your exam, your doctor will likely find the problem.  If there is a vaginal fluid, it may be checked for infection. HOW WILL THIS PROBLEM BE TREATED? Keep the vulvar skin as clean as possible. Moisturizers and lubricants can help with some of the symptoms. Estrogen replacement can help. There are 2 ways to take  estrogen:  Systemic estrogen gets estrogen to your whole body. It takes many weeks or months before the symptoms get better.  You take an estrogen pill.  You use a skin patch. This is a patch that you put on your skin.  If you still have your uterus, your doctor may ask you to take a hormone. Talk to your doctor about the right medicine for you.  Estrogen cream.  This puts estrogen only at the part of your body where you apply it. The cream is put into the vagina or put on the vulvar skin. For some women, estrogen cream works faster than pills or the patch. CAN ALL WOMEN WITH THIS PROBLEM USE ESTROGEN? No. Women with certain types of cancer, liver problems, or problems with blood clots should not take estrogen. Your doctor can help you decide the best treatment for your symptoms. Document Released: 07/24/2007 Document Revised: 02/09/2013 Document Reviewed: 07/24/2007 [facility] Patient Information 2015 [place], [place]. This information is not intended to replace advice given to you by your health care provider. Make sure you discuss any questions you have with your health care provider.   Grief Reaction Grief is a normal response to the death of someone close to you. Feelings of fear, anger, and guilt can affect almost everyone who loses someone they love. Symptoms of depression are also common. These include problems with sleep, loss of appetite, and lack of energy. These grief reaction symptoms often last for weeks to months after a  loss. They may also return during special times that remind you of the person you lost, such as an anniversary or birthday. Anxiety, insomnia, irritability, and deep depression may last beyond the period of normal grief. If you experience these feelings for 6 months or longer, you may have clinical depression. Clinical depression requires further medical attention. If you think that you have clinical depression, you should contact your caregiver. If you have a history  of depression or a family history of depression, you are at greater risk of clinical depression. You are also at greater risk of developing clinical depression if the loss was traumatic or the loss was of someone with whom you had unresolved issues.  A grief reaction can become complicated by being blocked. This means being unable to cry or express extreme emotions. This may prolong the grieving period and worsen the emotional effects of the loss. Mourning is a natural event in human life. A healthy grief reaction is one that is not blocked. It requires a time of sadness and readjustment. It is very important to share your sorrow and fear with others, especially close friends and family. Professional counselors and clergy can also help you process your grief. Document Released: 02/04/2005 Document Revised: 06/21/2013 Document Reviewed: 10/15/2005 [facility] Patient Information 2015 [place], [place]. This information is not intended to replace advice given to you by your health care provider. Make sure you discuss any questions you have with your health care provider.

## 2013-11-17 NOTE — Progress Notes (Signed)
Establish Care Complaining of burning after urination and abdominal pressure

## 2013-11-17 NOTE — Progress Notes (Signed)
   Subjective:    Patient ID: Jill Vega, female    DOB: 10-16-1950, 63 y.o.   MRN: [id number] CC: establish care, pain after urination  HPI 63 year old female presents to the status care discussed the following:  #1 pelvic pain: Patient with one month of pelvic pain described as pressure burning sensation in her suprapubic and right lower quadrant following urination. Symptoms started acutely after she was helping a neighbor move and he was a heavy lifting. Initially she also had bilateral lower abdominal pain, but these symptoms have resolved. Patient denies GI upset. Patient denies diarrhea and constipation. No fever or chills. Patient admits to weight loss losing 40 pounds over the last 3 years related to the death of her son and daughter.  #2 depression/bereavement: The patient's son died in May of 2014, due to palpitations of  cancer. The patient's daughter died in May 20 12th was murdered. Following the death of her son patient developed depressive symptoms which include tearfulness, anorexia, depressed mood. Patient has sought help with bereavement counseling without improvement.  Soc hx: non smoker  Review of Systems As per HPI     Objective:   Physical Exam BP 150/84  Pulse 84  Temp(Src) 97.8 F (36.6 C) (Oral)  Resp 18  Ht 5' 10.5" (1.791 m)  Wt 181 lb (82.101 kg)  BMI 25.60 kg/m2  SpO2 100% General appearance: alert, cooperative and no distress affect is depressed, patient was tearful during the exam.  Abdomen: soft, non-tender; bowel sounds normal; no masses,  no organomegaly Pelvic: vaginal mucosal atrophic, normal external genitalia, no vaginal discharge, no prolapse, cervix normal w/o lesions or discharge. No uterine or adnexal mass or tenderness.        Assessment & Plan:

## 2013-11-18 ENCOUNTER — Ambulatory Visit: Payer: Self-pay | Attending: Family Medicine | Admitting: *Deleted

## 2013-11-18 DIAGNOSIS — F4329 Adjustment disorder with other symptoms: Secondary | ICD-10-CM

## 2013-11-18 DIAGNOSIS — Z634 Disappearance and death of family member: Principal | ICD-10-CM

## 2013-11-18 DIAGNOSIS — F4321 Adjustment disorder with depressed mood: Secondary | ICD-10-CM

## 2013-11-18 LAB — CERVICOVAGINAL ANCILLARY ONLY
CHLAMYDIA, DNA PROBE: NEGATIVE
Neisseria Gonorrhea: NEGATIVE

## 2013-11-18 NOTE — Progress Notes (Signed)
Patient presented to LCSW with complicated grief and depression.  Patient shared the stories of the death of her daughter and son.  Patient shared several events of their lives, their dying and her recovery since.  Patient was very emotional and tearful throughout.  LCSW provided comfort and listened and began the psychoeducational process of teaching about the grief process and support opportunities to move beyond the grieving of the patient into life goals and plans.  LCSW supported patient through the process and allowed for rapport building.  LCSW also helped patient understand her process for getting further support in this clinic.  Patient will return on 11/29/13.  Beverly Sessionsywan J Somalia Segler MSW, LCSW Duration 150 minutes

## 2013-11-19 ENCOUNTER — Telehealth: Payer: Self-pay | Admitting: Family Medicine

## 2013-11-19 NOTE — Telephone Encounter (Signed)
Pt. Called to request results of pap smear. Please f/u with pt.

## 2013-11-22 ENCOUNTER — Telehealth: Payer: Self-pay | Admitting: Family Medicine

## 2013-11-22 LAB — CYTOLOGY - PAP

## 2013-11-22 NOTE — Telephone Encounter (Signed)
Pt is calling to find out about her results from her pap smear. Please follow up with pt.

## 2013-11-23 ENCOUNTER — Telehealth: Payer: Self-pay | Admitting: Emergency Medicine

## 2013-11-23 NOTE — Telephone Encounter (Signed)
Message copied by Dyann KiefGIRALDEZ, Tamika Nou M on Tue Nov 23, 2013 12:43 PM ------      Message from: Dessa PhiFUNCHES, JOSALYN      Created: Thu Nov 18, 2013  4:09 PM       Negative Gc/chlam ------

## 2013-11-23 NOTE — Telephone Encounter (Signed)
Pt received negative pap smear results. Instructed last one needed at age 63.

## 2013-11-29 ENCOUNTER — Ambulatory Visit: Payer: Self-pay | Attending: Family Medicine

## 2013-12-06 ENCOUNTER — Ambulatory Visit: Payer: Self-pay | Attending: Family Medicine | Admitting: *Deleted

## 2013-12-06 DIAGNOSIS — F4321 Adjustment disorder with depressed mood: Secondary | ICD-10-CM

## 2013-12-06 DIAGNOSIS — F4329 Adjustment disorder with other symptoms: Secondary | ICD-10-CM

## 2013-12-06 DIAGNOSIS — Z634 Disappearance and death of family member: Principal | ICD-10-CM

## 2013-12-06 NOTE — Progress Notes (Signed)
LCSW met with patient who is continuing to deal with complicated grief over the loss of her two children.  Patient presented much less tearful than the last meeting.  LCSW also commented to patient about how her presentation had also improved.  Patient initially arrived very agitated and fidgety, but within 20 minutes of processing and venting patient calmed down and was receptive to conversation.  LCSW worked with patient to identify the current positives in her life by identifying them as 'light'. This light she stated that she had lost in from her children but LCSW processed the source of the light within herself and with her husband.  Patient was receptive to this and stated that she would. LCSW also processed with patient moments of celebration from her past and this was helpful for patient.   Patient will return in 1-2 weeks for follow up.  Jill Vega MSW, LCSW Duration 90 minutes

## 2013-12-09 ENCOUNTER — Ambulatory Visit: Payer: Self-pay | Admitting: Family Medicine

## 2013-12-14 ENCOUNTER — Ambulatory Visit: Payer: Self-pay
